# Patient Record
Sex: Male | Born: 1960 | Race: White | Hispanic: No | Marital: Single | Smoking: Former smoker
Health system: Southern US, Community
[De-identification: ages and names within clinical notes are randomized; demographics above are authoritative.]

## PROBLEM LIST (undated history)

## (undated) DIAGNOSIS — M25569 Pain in unspecified knee: Secondary | ICD-10-CM

## (undated) DIAGNOSIS — E785 Hyperlipidemia, unspecified: Secondary | ICD-10-CM

## (undated) DIAGNOSIS — E669 Obesity, unspecified: Secondary | ICD-10-CM

## (undated) DIAGNOSIS — E039 Hypothyroidism, unspecified: Secondary | ICD-10-CM

## (undated) DIAGNOSIS — I2541 Coronary artery aneurysm: Secondary | ICD-10-CM

## (undated) DIAGNOSIS — I10 Essential (primary) hypertension: Secondary | ICD-10-CM

## (undated) HISTORY — DX: Hypothyroidism, unspecified: E03.9

## (undated) HISTORY — PX: GALLBLADDER SURGERY: SHX652

## (undated) HISTORY — PX: LEG SURGERY: SHX1003

## (undated) HISTORY — DX: Coronary artery aneurysm: I25.41

## (undated) HISTORY — DX: Pain in unspecified knee: M25.569

## (undated) HISTORY — DX: Obesity, unspecified: E66.9

## (undated) HISTORY — DX: Hyperlipidemia, unspecified: E78.5

## (undated) HISTORY — PX: KNEE SURGERY: SHX244

---
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
## 2010-05-11 ENCOUNTER — Emergency Department (HOSPITAL_COMMUNITY): Admission: EM | Admit: 2010-05-11 | Discharge: 2010-05-11 | Payer: Self-pay | Admitting: Family Medicine

## 2010-12-17 ENCOUNTER — Observation Stay (HOSPITAL_COMMUNITY)
Admission: AD | Admit: 2010-12-17 | Discharge: 2010-12-18 | Disposition: A | Discharge status: Home / Self Care | Payer: 59 | Source: Other Acute Inpatient Hospital | Attending: Internal Medicine | Admitting: Internal Medicine

## 2010-12-17 ENCOUNTER — Observation Stay (HOSPITAL_COMMUNITY)
Admission: EM | Admit: 2010-12-17 | Discharge: 2010-12-17 | Disposition: A | Discharge status: Other Acute Inpatient Hospital | Payer: 59 | Source: Home / Self Care | Attending: Emergency Medicine | Admitting: Emergency Medicine

## 2010-12-17 ENCOUNTER — Emergency Department (HOSPITAL_COMMUNITY): Payer: 59

## 2010-12-17 DIAGNOSIS — R0602 Shortness of breath: Secondary | ICD-10-CM | POA: Insufficient documentation

## 2010-12-17 DIAGNOSIS — E785 Hyperlipidemia, unspecified: Secondary | ICD-10-CM | POA: Insufficient documentation

## 2010-12-17 DIAGNOSIS — F411 Generalized anxiety disorder: Secondary | ICD-10-CM | POA: Insufficient documentation

## 2010-12-17 DIAGNOSIS — R209 Unspecified disturbances of skin sensation: Secondary | ICD-10-CM | POA: Insufficient documentation

## 2010-12-17 DIAGNOSIS — Z79899 Other long term (current) drug therapy: Secondary | ICD-10-CM | POA: Insufficient documentation

## 2010-12-17 DIAGNOSIS — R079 Chest pain, unspecified: Principal | ICD-10-CM | POA: Insufficient documentation

## 2010-12-17 DIAGNOSIS — J42 Unspecified chronic bronchitis: Secondary | ICD-10-CM | POA: Insufficient documentation

## 2010-12-17 DIAGNOSIS — G43909 Migraine, unspecified, not intractable, without status migrainosus: Secondary | ICD-10-CM | POA: Insufficient documentation

## 2010-12-17 DIAGNOSIS — Z87891 Personal history of nicotine dependence: Secondary | ICD-10-CM | POA: Insufficient documentation

## 2010-12-17 DIAGNOSIS — I10 Essential (primary) hypertension: Secondary | ICD-10-CM | POA: Insufficient documentation

## 2010-12-17 DIAGNOSIS — Z8249 Family history of ischemic heart disease and other diseases of the circulatory system: Secondary | ICD-10-CM | POA: Insufficient documentation

## 2010-12-17 DIAGNOSIS — I253 Aneurysm of heart: Secondary | ICD-10-CM | POA: Insufficient documentation

## 2010-12-17 LAB — BASIC METABOLIC PANEL
BUN: 22 mg/dL (ref 6–23)
CO2: 29 mEq/L (ref 19–32)
Calcium: 9.2 mg/dL (ref 8.4–10.5)
GFR calc non Af Amer: >60 mL/min (ref >60)
Glucose, Bld: 141 mg/dL — ABNORMAL HIGH (ref 70–99)
Potassium: 4.2 mEq/L (ref 3.5–5.1)
Sodium: 140 mEq/L (ref 135–145)

## 2010-12-17 LAB — CBC
MCHC: 31.7 g/dL (ref 30.0–36.0)
Platelets: 278 10*3/uL (ref 150–400)
RDW: 14.5 % (ref 11.5–15.5)
WBC: 8.5 10*3/uL (ref 4.0–10.5)

## 2010-12-17 LAB — POCT CARDIAC MARKERS
CKMB, poc: <1 ng/mL — ABNORMAL LOW (ref 1.0–8.0)
Myoglobin, poc: 100 ng/mL (ref 12–200)

## 2010-12-17 LAB — DIFFERENTIAL
Basophils Absolute: 0 10*3/uL (ref 0.0–0.1)
Eosinophils Absolute: 0.1 10*3/uL (ref 0.0–0.7)
Eosinophils Relative: 1 % (ref 0–5)
Monocytes Absolute: 0.8 10*3/uL (ref 0.1–1.0)

## 2010-12-17 LAB — CARDIAC PANEL(CRET KIN+CKTOT+MB+TROPI)
CK, MB: 1 ng/mL (ref 0.3–4.0)
Relative Index: 0.8 (ref 0.0–2.5)

## 2010-12-18 ENCOUNTER — Observation Stay (HOSPITAL_COMMUNITY): Payer: 59

## 2010-12-18 DIAGNOSIS — M79609 Pain in unspecified limb: Secondary | ICD-10-CM

## 2010-12-18 DIAGNOSIS — I2510 Atherosclerotic heart disease of native coronary artery without angina pectoris: Secondary | ICD-10-CM

## 2010-12-18 DIAGNOSIS — Z01810 Encounter for preprocedural cardiovascular examination: Secondary | ICD-10-CM

## 2010-12-18 DIAGNOSIS — I2541 Coronary artery aneurysm: Secondary | ICD-10-CM

## 2010-12-18 LAB — CBC
HCT: 39.6 % (ref 39.0–52.0)
Hemoglobin: 12.9 g/dL — ABNORMAL LOW (ref 13.0–17.0)
MCHC: 32.6 g/dL (ref 30.0–36.0)
RBC: 4.62 MIL/uL (ref 4.22–5.81)

## 2010-12-18 LAB — BASIC METABOLIC PANEL
CO2: 29 mEq/L (ref 19–32)
Calcium: 8.6 mg/dL (ref 8.4–10.5)
Chloride: 104 mEq/L (ref 96–112)
GFR calc Af Amer: >60 mL/min (ref >60)
Glucose, Bld: 119 mg/dL — ABNORMAL HIGH (ref 70–99)
Sodium: 140 mEq/L (ref 135–145)

## 2010-12-18 LAB — D-DIMER, QUANTITATIVE: D-Dimer, Quant: 0.78 ug/mL-FEU — ABNORMAL HIGH (ref 0.00–0.48)

## 2010-12-25 NOTE — Consult Note (Signed)
NAMEBENNO, Edward Odom                ACCOUNT NO.:  [id number]  MEDICAL RECORD NO.:  [id number]           PATIENT TYPE:  O  LOCATION:  6533                         FACILITY:  [facility]  PHYSICIAN:  Kerin Perna, M.D.  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  The patient has had atypical chest pain and other pains for several years.  He was admitted to the hospital within the past 24 hours with substernal chest pain, which radiated to the left arm starting at about 2 a.m.  There was some transient shortness of breath as well.  He took the nitroglycerin he had at home and he states there was some relief.  He presented to the emergency department where his EKG was nonspecific and his troponin was negative.  The patient states he had a stress test performed within the last 2-3 years in [place], which was negative by his report.  The patient was evaluated by Dr. Eldridge Dace who recommended cardiac cath as he felt the stress test would be difficult to interpret in this patient with severe morbid obesity. Cardiac catheterization demonstrated no significant obstructive coronary disease with an EF of 60%.  There was dilatation of the ostium of the left main, which by measurement is probably 6-8 mm in diameter.  There is no evidence of a root aneurysm, dissection, or thrombus within the left main.  LVEDP was 25 and there is no  significant transaortic valve blood pressure gradient.  Because of the abnormality of the left main anatomy, a thoracic surgical evaluation was requested.  The patient states coronary disease runs heavily in his father's family.  He has never had a previous cardiac cath.  He is a nondiabetic.  He stopped smoking approximately 2-3 years ago and gained weight from 245-360 pounds currently.  PAST MEDICAL HISTORY: 1. Hypertension. 2. Morbid obesity. 3. Dyslipidemia. 4. History of migraine headaches. 5. Status post cholecystectomy, status post appendectomy.  HOME MEDICATIONS: 1. Pain patch one b.i.d. p.r.n. 2. Lasix 20 mg daily p.r.n. 3. Capsaicin patch 0.1 cm topically q.i.d. as needed. 4. Nitroglycerin p.r.n. chest pain. 5. Ibuprofen 200 mg t.i.d. for headaches. 6. Aleve 200 mg t.i.d. p.r.n. pain. 7. Doxazosin 4 mg p.o. at bedtime. 8. Procardia XL 90 mg q.a.m. 9. Triamterene/hydrochlorothiazide 75/50 mg p.o. q.p.m.  ALLERGIES:  The patient has intolerance to DEMEROL, but no true drug allergies in general.  SOCIAL HISTORY:  The patient is divorced and currently is an Production designer, theatre/television/film for outpatient dialysis center.  Has recently moved to [facility] from [place].  Is living with a friend.  He smoked  up until 3 years ago.  He denies significant alcohol intake.  FAMILY HISTORY:  Positive for coronary artery disease, cancer, and kidney disease.  REVIEW OF SYSTEMS:  CONSTITUTIONAL:  Positive for weight gain of 245-360 pounds over the past 3 years.  No history of fever or night sweats. ENT:  Negative for dental complaints, difficulty swallowing, or change in vision.  THORACIC:  Negative for history of thoracic trauma, pneumothorax, fractured ribs, pleural effusion, or hemoptysis.  CARDIAC: Positive for coronary artery disease.  Negative for history of murmur, arrhythmia.  2-D echo is pending.  GI:  Significant for his previous abdominal surgery as previously listed.   Negative for hepatitis, jaundice, or bright red blood per rectum.  UROLOGIC:  Negative for BPH or kidney stone.  VASCULAR:  Negative for DVT, claudication, or TIA. NEUROLOGIC:  Negative for stroke or seizures.  He has had several brain CT scans in the past for migraine headaches, which showed no other abnormalities.  HEMATOLOGIC:  Negative for bleeding disorder or previous blood transfusion.  PHYSICAL EXAMINATION:  VITAL SIGNS:  The patient is 5 feet 9 inches, weight is 360 pounds, blood pressure 155/90, pulse 80 sinus rhythm, saturation 97% on room air, he is afebrile. GENERAL APPEARANCE:  A middle-aged Caucasian male in his cardiology step- down unit with a dressing over the right wrist where he was catheterized 12 hours previously.  He is no distress. HEENT:  Normocephalic.  Pupils equal. NECK:  Without JVD, mass, or bruit. LYMPHATICS:  No palpable supraclavicular or cervical adenopathy.  Breath sounds are clear and there is no thoracic deformity other than his obesity. CARDIAC:  Regular rate and rhythm without S3 gallop or murmur. ABDOMEN:  Obese, soft without pulsatile mass. EXTREMITIES:  No clubbing or cyanosis.  There is mild pedal edema.NEUROLOGIC:  Alert and oriented without focal motor deficit. SKIN:  Without rash or lesion.  LABORATORY DATA:  Creatinine is 1.0.  Hemoglobin 14.5.  Glucose 140. Troponin normal.  D-dimer ordered is pending.  IMPRESSION AND PLAN:  The patient has a moderate aneurysm of the left main extending from the ostium to the takeoff of the left anterior descending and circumflex.  There is no evidence of dissection or thrombus and it is of moderate size.  There is no occlusive coronary artery disease.  He has preserved LV function.  Dr. Hoyle Barr opinion with which I concur is that his chest pain is not related to his coronary anatomy.  I would not recommend a surgical reconstruction of his coronaries based on this left main aneurysm noted on cath,  which I feel is a probable incidental finding; however, this will need to be followed serially.  I believe the risks of coronary artery disease in this 360-pound man would be a significant higher than risk of a coronary event from this left main aneurysm.  I would recommend following, the patient's coronary anatomy with serial cardiac CT scans, add a statin and daily aspirin to his current medications, as well as to continue good blood pressure control and weight reduction. While he is hospitalized, I would rule out PE, rule out other possible aneurysms in his neck or abdomen, and recommend a nutritional consult for weight loss.  Thank you for the consultation.     Kerin Perna, M.D.     PV/MEDQ  D:  12/18/2010  T:  12/19/2010  Job:  [id number]  cc:   Corky Crafts, MD  Electronically Signed by Kerin Perna M.D. on 12/25/2010 06:47:38 PM

## 2010-12-27 NOTE — Discharge Summary (Signed)
NAMEDIARRA, Edward Odom                ACCOUNT NO.:  [id number]  MEDICAL RECORD NO.:  [id number]           PATIENT TYPE:  O  LOCATION:  6533                         FACILITY:  [facility]  PHYSICIAN:  Lonia Blood, M.D.       DATE OF BIRTH:  01/21/1961  DATE OF ADMISSION:  12/17/2010 DATE OF DISCHARGE:  12/18/2010                              DISCHARGE SUMMARY   PRIMARY CARE PHYSICIAN:  This patient has been referred to [place] [facility].  PRIMARY CARDIOLOGIST:  Corky Crafts, MD from [facility] Cardiology.  DISCHARGE DIAGNOSES: 1. Musculoskeletal chest pain. 2. Incidental finding of left main moderate aneurysm. 3. Morbid obesity. 4. Hypertension. 5. Hyperlipidemia.  DISCHARGE MEDICATIONS: 1. Aleve 220 mg two tablets every 8 hours as needed for pain. 2. Capsaicin to the knees four times a day as needed. 3. Doxazosin 4 mg daily. 4. Lasix 20 mg daily as needed for fluid. 5. Nitroglycerin 0.4 mg under tongue every 5 minutes as needed for     chest pain. 6. Nifedipine extended release 90 mg daily. 7. Atenolol 25 mg daily. 8. Salonpas pain relief patch every 12 hours as needed. 9. Triamterene/hydrochlorothiazide 75/50 one tablet daily.  CONDITION ON DISCHARGE:  [name] was discharged in good condition, alert, oriented in no acute distress.  He will follow up with Dr. Eldridge Dace from [facility] Cardiology and with Dr. Kathlee Nations Trigt from Thoracic Surgery.  HISTORY AND PHYSICAL:  Refer to dictated H and P done by Dr. Cleotis Lema.  PROCEDURES THIS ADMISSION: 1. On December 17 1010, the patient underwent a cardiac catheterization,     findings of a moderate left main aortic aneurysm, mid-to-distal     portion; anomalous takeoff of the RCA, normal ventricular function. 2. Abdominal ultrasound, which was negative for finding of abdominal     aortic aneurysm. 3. Carotid ultrasound, which was negative for carotid artery disease. 4. Venous ultrasound, which was negative for deep vein thrombosis,   positive for Baker cyst at the left knee. 5. Peripheral arterial Dopplers, which were negative for findings of     any peripheral vascular disease. 6. Transthoracic echocardiogram revealed ejection fraction 55-60%,     some diastolic dysfunction.  HOSPITAL COURSE:  [name] is a 50 year old gentleman with hypertension, hyperlipidemia, and morbid obesity, presented to the Emergency Room of [facility] on December 17, 2010, with some complaints of chest pain.  He was admitted to telemetry at the [facility] and he had cardiac markers, which were within normal limits.  His troponins were 0.01 x3.  He was taken to the Cardiac Catheterization Lab of [facility] on December 17, 2010 and an incidental finding of a left main aneurysm was found.  Because of that, the patient was seen in consultation by Dr. Kathlee Nations Trigt from Thoracic Surgery and he felt that this aneurysm could be monitored as an outpatient.  [name] was continued on his antihypertensives and he is going to follow a weight reduction program before any elective coronary artery aneurysm repair.     Lonia Blood, M.D.     SL/MEDQ  D:  12/25/2010  T:  12/26/2010  Job:  [id number]  cc:   Corky Crafts, MD Kerin Perna, M.D.  Electronically Signed by Lonia Blood M.D. on 12/27/2010 02:17:26 PM

## 2011-01-03 NOTE — H&P (Addendum)
NAMERUEL, DIMMICK                ACCOUNT NO.:  [id number]  MEDICAL RECORD NO.:  [id number]           PATIENT TYPE:  E  LOCATION:  WLED                         FACILITY:  [facility]  PHYSICIAN:  Baltazar Najjar, MD     DATE OF BIRTH:  August 09, 1961  DATE OF ADMISSION:  12/17/2010 DATE OF DISCHARGE:                             HISTORY & PHYSICAL   PRIMARY CARE PHYSICIAN:  Unassigned.  The patient's PCP is from [place] area.  CODE STATUS:  Full code.  CHIEF COMPLAINT:  Chest pain.  HISTORY OF PRESENT ILLNESS:  [name] is a 50 year old morbidly obese Caucasian man who presented to the ER today with chief complaint of chest pain started early morning today around 2:00 a.m.  He described his chest pain as dull pain radiating to his left arm with transient tingling in his right upper extremity.  His pain lasted about 20 minutes and resolved with 2 doses of nitroglycerin.  He denies any associated diaphoresis or nausea.  However, he had transient shortness of breath. As per the patient, he has been experiencing similar chest pains on and off in the past.  He had his initially episode back in 1992, at that time was diagnosed with anxiety and in the last couple of years he has been prescribed nitroglycerin for angina.  As per him, he had stress test done in [place] which was unremarkable.  He has never seen a cardiologist and never had a cardiac cath in the past.  In the ER, the patient's EKG was unremarkable.  His troponin was negative and we are asked to see him for admission to rule out acute coronary syndrome and for cardiac workup.  PAST MEDICAL HISTORY: 1. Hypertension. 2. Hyperlipidemia. 3. Morbid obesity. 4. Migraine headaches.  ALLERGIES:  No known drug allergy.  However, he requested to list DEMEROL as allergy for him because as per him, he just do not like it. However, he does not report any specific allergic symptoms to Demerol.  HOME MEDICATIONS: 1.  Salonpas patch one patch every 12 hours as needed for pain. 2. Lasix 20 mg p.o. daily p.r.n., he took that for 7 days. 3. Capsaicin 0.1 cm topically 4 times a day as needed. 4. Nitroglycerin sublingual 0.4 mg q.5 minutes p.r.n. x3 doses. 5. Ibuprofen 200 mg q.8 h p.r.n. for headaches. 6. Aleve 220 mg 2 tablets q.8 h p.r.n. for pain. 7. Doxazosin 4 mg 1 tablet daily at bedtime. 8. Procardia XL 90 mg 1 tablet q.a.m. 9. Triamterene/hydrochlorothiazide 75/50 mg q.p.m..  SOCIAL HISTORY:  He is divorced.  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  Relocated recently from [place] to [place] area.  Used to smoke in the past for about 10 years, used to smoke one pack a day of cigarettes, quit 2 years ago.  Drinks alcohol occasionally.  Denies any illicit drug use.  FAMILY HISTORY:  Significant for cardiac disease in his both parents side.  Also kidney disease and cancers.  REVIEW OF SYSTEMS:  As above in HPI.  Other systems reported negative. CHEST:  Significant for shortness of breath  with exertion and rest. CARDIOVASCULAR:  Significant for chest pain as above.  ABDOMEN:  Denies any nausea or vomiting or change in his bowel habits.  GU: Denies any dysuria, however, he stated that he had occasional increasing urinary frequency.  No hematuria, constipation.  No fever or chills.  PHYSICAL EXAMINATION:  VITAL SIGNS:  Blood pressure 164/91, pulse 85, temperature 98, O2 sat 97% on room air. GENERAL:  He is alert, not in acute distress. NECK:  Supple.  No JVD. LUNGS:  Showed decreased breathing sounds with no rales or wheezing. CARDIOVASCULAR:  S1, S2.  Regular rhythm and rate. ABDOMEN:  Obese, soft, nontender. EXTREMITIES:  Showed trace edema. NEUROLOGICAL EXAM:  He is fully alert and oriented x3.  No focal neurological deficit appreciated.  LABORATORY DATA:  Potassium 4.2, BUN 22, creatinine 1.01.  GFR more than 6.  Glucose 141, troponin less than 0.05.  Hemoglobin 14.5,  WBC is 8.5, platelet count 278.  RADIOLOGY/IMAGING DATA:  Chest x-ray showed no acute finding.  EKG showed normal sinus rhythm at 92 beats per minutes, possible left atrial enlargement.  ASSESSMENT/PLAN:  The patient is a 50 year old morbidly obese man with hypertension, hyperlipidemia, former smoker, positive family history for cardiac disease.  PLAN: 1. We will admit the patient to telemetry floor. 2. We will cycle cardiac enzymes x3. 3. Continue with nitroglycerin p.r.n.  We will also add aspirin 81 mg     p.o. daily and add metoprolol 12.5 mg b.i.d. for blood pressure     control and for cardioprotective reasons. 4. I will order 2-D echocardiogram. 5. We will consult Cardiology for further cardiac workup. 6. Check lipid panel and we can start him on simvastatin as needed. 7. The patient quit smoking 2 years ago and he is not actively     smoking. 8. Encouraged to lose some weight and exercise and lifestyle     modification. 9. The patient is a full code. 10.We will ask social worker to assign him a PCP on discharge.    ______________________________ Baltazar Najjar, MD   ______________________________ Baltazar Najjar, MD    SA/MEDQ  D:  12/17/2010  T:  12/17/2010  Job:  [id number]  Electronically Signed by Hannah Beat MD on 01/05/2011 08:08:38 PM

## 2011-01-10 NOTE — Cardiovascular Report (Signed)
NAMEVIYAN, Edward Odom                ACCOUNT NO.:  [id number]  MEDICAL RECORD NO.:  [id number]           PATIENT TYPE:  O  LOCATION:  6533                         FACILITY:  [facility]  PHYSICIAN:  Corky Crafts, MDDATE OF BIRTH:  Nov 03, 1960  DATE OF PROCEDURE:  12/17/2010 DATE OF DISCHARGE:                           CARDIAC CATHETERIZATION   PROCEDURES PERFORMED:  Left heart catheterization, left ventriculogram, coronary angiogram of the aortic root shot.  OPERATOR:  Corky Crafts, MD  INDICATIONS:  Persistent chest pain.  PROCEDURE NARRATIVE:  The risks and benefits of cardiac catheterization were explained to the patient.  Informed consent was obtained.  He was brought to the cath lab.  He was prepped and draped in the usual sterile fashion.  His right wrist was infiltrated with 1% lidocaine.  A 5-French glide sheath was placed into the right radial artery.  There was difficulty in navigating the wire up the radial and angiogram was performed through the sheath.  Verapamil and nitroglycerin were given through the sheath on multiple occasions.  Left coronary angiography was performed using a JL-3.5 catheter.  The catheter was advanced to the vessel ostium under fluoroscopic guidance.  Digital angiography was performed in multiple projections using hand injection of contrast. Right coronary artery angiography was performed using eventually an LCB guiding catheter.  The right coronary has a anomalous takeoff.  It is more anterior.  The pigtail catheter was used to cross the aortic valve under fluoroscopic guidance.  A power injection of contrast was performed in the RAO projection image.  The left ventricle catheter was withdrawn under continuous hemodynamic pressure monitoring.  Aortic root shot was done with a power injection of contrast in the RAO projection to help image the right coronary artery.  The sheath was removed and a TR band was placed.  The case was done  with heparin used for anticoagulation throughout.  FINDINGS:  The left main is patent and normal size proximally.  In the mid to distal left main, there is a large aneurysm.  The left-sided branches then arise from this aneurysm.  The left circumflex is a medium- sized vessel and appears widely patent.  There is a ramus vessel which is medium-sized and widely patent.  The left anterior descending is a large vessel which reaches the apex. There are minimal mid vessel irregularities.  There is a diagonal which likely was a proximal diagonal off of the LAD.  However, due to the distal left main aneurysm, it comes off separately from the aneurysm itself.  It appears widely patent.  The right coronary artery has an anterior takeoff.  It is very tortuous in the mid section and it is a large dominant vessel.  There is a large PDA.  The entire right coronary artery system is widely patent.  The right radial angiogram showed no significant loops for tortuosity.  Left ventriculogram showed normal ventricular function with an ejection fraction of 60%.  HEMODYNAMIC RESULTS:  135/20 with a left ventricular pressure.  LVEDP of 25 mmHg.  Aortic pressure 143/104 with a mean aortic pressure of 124 mmHg. Aortic root shows no thoracic  aneurysm.  IMPRESSION: 1. Large left main aneurysm in the mid to distal portion which     distorted some of the normal left coronary artery anatomy.  There     is no hemodynamically significant atherosclerosis in the left     coronary artery system. 2. 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 3. Normal ventricular function. 4. Mildly increased LVEDP. 5. Normal thoracic aorta.  RECOMMENDATIONS:  Continue risk factor modification including blood pressure control and encouraged weight loss.  We will consult cardiothoracic surgery to obtain assistance with management of this  left main aneurysm.     Corky Crafts, MD     JSV/MEDQ  D:  12/17/2010  T:  12/18/2010  Job:  [id number]  Electronically Signed by Lance Muss MD on 01/10/2011 01:32:08 PM

## 2011-03-07 ENCOUNTER — Other Ambulatory Visit: Payer: Self-pay | Admitting: Cardiothoracic Surgery

## 2011-03-07 DIAGNOSIS — I2541 Coronary artery aneurysm: Secondary | ICD-10-CM

## 2011-04-12 ENCOUNTER — Encounter (HOSPITAL_COMMUNITY): Payer: Self-pay

## 2011-04-12 ENCOUNTER — Ambulatory Visit (HOSPITAL_COMMUNITY)
Admission: RE | Admit: 2011-04-12 | Discharge: 2011-04-12 | Disposition: A | Discharge status: Home / Self Care | Payer: 59 | Source: Ambulatory Visit | Attending: Cardiothoracic Surgery | Admitting: Cardiothoracic Surgery

## 2011-04-12 ENCOUNTER — Other Ambulatory Visit: Payer: Self-pay | Admitting: Cardiothoracic Surgery

## 2011-04-12 DIAGNOSIS — I728 Aneurysm of other specified arteries: Secondary | ICD-10-CM | POA: Insufficient documentation

## 2011-04-12 DIAGNOSIS — I2541 Coronary artery aneurysm: Secondary | ICD-10-CM

## 2011-04-12 DIAGNOSIS — K7689 Other specified diseases of liver: Secondary | ICD-10-CM | POA: Insufficient documentation

## 2011-04-12 HISTORY — DX: Essential (primary) hypertension: I10

## 2011-04-12 MED ORDER — IOHEXOL 350 MG/ML SOLN
80.0000 mL | Freq: Once | INTRAVENOUS | Status: AC | PRN
  Administered 2011-04-12: 80 mL via INTRAVENOUS

## 2011-04-17 ENCOUNTER — Ambulatory Visit (INDEPENDENT_AMBULATORY_CARE_PROVIDER_SITE_OTHER): Payer: 59 | Admitting: Cardiothoracic Surgery

## 2011-04-17 DIAGNOSIS — I253 Aneurysm of heart: Secondary | ICD-10-CM

## 2011-04-18 NOTE — Assessment & Plan Note (Signed)
OFFICE VISIT  JACOBY, ZANNI DOB:  12/17/1960                                        April 17, 2011 CHART #:  [id number]  CURRENT PROBLEMS: 1. Left main coronary aneurysm. 2. Morbid obesity. 3. Hypertension. 4. Sleep apnea. 5. Type 2 diabetes mellitus.  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  The patient had no obstructive coronary artery disease but was noted to have a left main aneurysm.  His EF was normal at 60% and LVEDP was 25.  Since the patient weighed 360 pounds and otherwise had clean coronaries, he was not felt to be an operative candidate since the risk of surgery would outweigh the risk of the coronary aneurysm.  The coronary aneurysm was moderate in size.  For followup, I felt that a cardiac CT scan would be the best way to follow the status of the coronary aneurysm and this was performed 5 days ago. It clearly shows a 1.2-cm aneurysm of the distal left main at the area of the bifurcation.  There was no evidence of thrombus, minimal plaque, or ulceration.  It should allow an active way to follow the status and size of his aneurysm for time.  In the interim since his hospitalization in April he has established care with Dr. Donette Larry and he has been found to have sleep apnea for which he takes a BiPAP mask and he has a hemoglobin A1c of 8.3.  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 His blood pressure is controlled with atenolol, Maxzide, doxazosin and Lasix.  He was found to be hypothyroid and was started on Synthroid.  He takes 81 mg of aspirin a day.  PHYSICAL EXAMINATION:  Vital Signs:  Blood pressure 146/80, pulse is 90, saturation 95%.  His weight is 380 pounds.  Cardiac:  Rhythm is  regular. Lungs:  Breath sounds are clear.  Extremities:  He has some ankle edema 1 to 2+.  LABORATORY DATA:  I reviewed the cardiac CT scan with the patient which clearly shows the aneurysm which appears moderate 1.2 cm in maximal diameter.  IMPRESSION AND PLAN:  The patient will be extremely high risk operative candidate and I feel the risk of surgery would be higher than the risk of this coronary aneurysm at this point and at the size.  I would recommend serial his cardiac CT scans to assess the aneurysm on an annual basis.  I would recommend increasing his aspirin to 325 mg a day to reduce the risk of platelet clumping and he understands the other risk factors for a vascular disease including blood pressure control, weight control, diabetes and regular exercise.  I plan on seeing him back in a year with a cardiac CT scan.  Kerin Perna, M.D. Electronically Signed  PV/MEDQ  D:  04/17/2011  T:  04/18/2011  Job:  [id number]  cc:   Vonzell Schlatter. Patsi Sears, M.D. Georgann Housekeeper, MD Corky Crafts, MD

## 2011-05-01 ENCOUNTER — Ambulatory Visit: Payer: 59 | Admitting: Cardiothoracic Surgery

## 2012-04-01 ENCOUNTER — Other Ambulatory Visit: Payer: Self-pay | Admitting: Cardiothoracic Surgery

## 2012-04-01 DIAGNOSIS — I253 Aneurysm of heart: Secondary | ICD-10-CM

## 2012-04-21 ENCOUNTER — Other Ambulatory Visit: Payer: Self-pay | Admitting: Cardiothoracic Surgery

## 2012-05-13 ENCOUNTER — Encounter: Payer: Self-pay | Admitting: Cardiothoracic Surgery

## 2012-05-13 ENCOUNTER — Ambulatory Visit (INDEPENDENT_AMBULATORY_CARE_PROVIDER_SITE_OTHER): Payer: 59 | Admitting: Cardiothoracic Surgery

## 2012-05-13 VITALS — BP 144/88 | HR 88 | Resp 20 | Ht 70.0 in | Wt 376.0 lb

## 2012-05-13 DIAGNOSIS — E039 Hypothyroidism, unspecified: Secondary | ICD-10-CM | POA: Insufficient documentation

## 2012-05-13 DIAGNOSIS — E785 Hyperlipidemia, unspecified: Secondary | ICD-10-CM | POA: Insufficient documentation

## 2012-05-13 DIAGNOSIS — I10 Essential (primary) hypertension: Secondary | ICD-10-CM | POA: Insufficient documentation

## 2012-05-13 DIAGNOSIS — I2541 Coronary artery aneurysm: Secondary | ICD-10-CM

## 2012-05-13 DIAGNOSIS — E119 Type 2 diabetes mellitus without complications: Secondary | ICD-10-CM

## 2012-05-13 NOTE — Progress Notes (Signed)
PCP is Georgann Housekeeper, MD Referring Provider is Corky Crafts., *  Chief Complaint  Patient presents with  . Follow-up    1 year f/u surveillance of coronary aneurysm,     HPI: Morbidly obese diabetic who is evaluated for chest pain 18 months ago with cardiac cath by Dr. Vertis Kelch. 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 The patient is done well without chest pain on aspirin beta blocker and his cholesterol has been normal. He has been unable to lose weight. He returns now for review of his situation.   Past Medical History  Diagnosis Date  . Hypertension   . Diabetes mellitus   . Hyperlipidemia   . Obesity   . Hypothyroidism   . Coronary artery aneurysm     No past surgical history on file.  No family history on file.  Social History History  Substance Use Topics  . Smoking status: Former Smoker    Types: Cigarettes  . Smokeless tobacco: Not on file  . Alcohol Use: No    Current Outpatient Prescriptions  Medication Sig Dispense Refill  . aspirin 325 MG tablet Take 325 mg by mouth daily.      Marland Kitchen atenolol (TENORMIN) 25 MG tablet Take 25 mg by mouth daily.      . Camphor-Menthol-Methyl Sal (SALONPAS) 1.2-5.7-6.3 % PTCH Apply topically.      . capsaicin (ZOSTRIX) 0.025 % cream Apply topically 2 (two) times daily.      . cetirizine (ZYRTEC) 10 MG tablet Take 10 mg by mouth daily.      Marland Kitchen doxazosin (CARDURA) 4 MG tablet Take 4 mg by mouth at bedtime.      Marland Kitchen glipiZIDE (GLUCOTROL) 5 MG tablet Take 5 mg by mouth 2 (two) times daily before a meal.      . levothyroxine (SYNTHROID, LEVOTHROID) 50 MCG tablet Take 50 mcg by mouth daily.      . metFORMIN (GLUCOPHAGE) 500 MG tablet Take 500 mg by mouth 2 (two) times daily with a meal.      . Misc Natural Products (OSTEO BI-FLEX JOINT SHIELD) TABS Take by mouth 2 (two) times daily.      . naproxen sodium (ANAPROX) 220 MG tablet Take 220 mg by mouth as needed.      Marland Kitchen  NIFEdipine (ADALAT CC) 90 MG 24 hr tablet Take 90 mg by mouth daily.      Marland Kitchen triamterene-hydrochlorothiazide (MAXZIDE) 75-50 MG per tablet Take 1 tablet by mouth daily.        Allergies  Allergen Reactions  . Demerol   . Metoprolol-Hydrochlorothiazide     Review of Systems no fever no weight loss no night sweats no chest pain  BP 144/88  Pulse 88  Resp 20  Ht 5\' 10"  (1.778 m)  Wt 376 lb (170.552 kg)  BMI 53.95 kg/m2  SpO2 94% Physical Exam Gen. 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 No symptoms. Repeat imaging with structural cardiac CT or coronary arteriogram will be needed which we'll we'll set up and and have the patient return the office for discussion. Certainly if the coronary aneurysm stays stable and asymptomatic I would not recommend elective surgery due to his morbid obesity, sleep apnea, diabetes, which would place the patient had an increased risk.  Plan:

## 2012-05-22 ENCOUNTER — Other Ambulatory Visit: Payer: Self-pay | Admitting: Cardiothoracic Surgery

## 2012-05-22 DIAGNOSIS — I2541 Coronary artery aneurysm: Secondary | ICD-10-CM

## 2012-05-26 ENCOUNTER — Encounter: Payer: Self-pay | Admitting: *Deleted

## 2012-05-26 LAB — CREATININE, SERUM: Creat: 0.8 mg/dL (ref 0.50–1.35)

## 2012-05-26 LAB — BUN: BUN: 19 mg/dL (ref 6–23)

## 2012-05-29 ENCOUNTER — Other Ambulatory Visit: Payer: Self-pay | Admitting: Cardiothoracic Surgery

## 2012-05-29 ENCOUNTER — Ambulatory Visit (HOSPITAL_COMMUNITY)
Admission: RE | Admit: 2012-05-29 | Discharge: 2012-05-29 | Disposition: A | Discharge status: Home / Self Care | Payer: 59 | Source: Ambulatory Visit | Attending: Cardiothoracic Surgery | Admitting: Cardiothoracic Surgery

## 2012-05-29 DIAGNOSIS — I253 Aneurysm of heart: Secondary | ICD-10-CM

## 2012-05-29 DIAGNOSIS — I2541 Coronary artery aneurysm: Secondary | ICD-10-CM | POA: Insufficient documentation

## 2012-05-29 DIAGNOSIS — K7689 Other specified diseases of liver: Secondary | ICD-10-CM | POA: Insufficient documentation

## 2012-05-29 DIAGNOSIS — I719 Aortic aneurysm of unspecified site, without rupture: Secondary | ICD-10-CM

## 2012-05-29 LAB — GLUCOSE, CAPILLARY: Glucose-Capillary: 152 mg/dL — ABNORMAL HIGH (ref 70–99)

## 2012-05-29 MED ORDER — DILTIAZEM HCL 25 MG/5ML IV SOLN
10.0000 mg | Freq: Once | INTRAVENOUS | Status: DC
  Filled 2012-05-29: qty 5

## 2012-05-29 MED ORDER — DILTIAZEM HCL 25 MG/5ML IV SOLN
10.0000 mg | Freq: Once | INTRAVENOUS | Status: AC
  Administered 2012-05-29: 10 mg via INTRAVENOUS

## 2012-05-29 MED ORDER — DILTIAZEM HCL 25 MG/5ML IV SOLN
5.0000 mg | Freq: Once | INTRAVENOUS | Status: AC
  Administered 2012-05-29: 5 mg via INTRAVENOUS
  Filled 2012-05-29: qty 5

## 2012-05-29 MED ORDER — NITROGLYCERIN 0.4 MG SL SUBL
0.4000 mg | SUBLINGUAL_TABLET | SUBLINGUAL | Status: DC | PRN
  Administered 2012-05-29: 0.4 mg via SUBLINGUAL

## 2012-05-29 MED ORDER — DILTIAZEM HCL 25 MG/5ML IV SOLN
5.0000 mg | Freq: Once | INTRAVENOUS | Status: AC
  Administered 2012-05-29: 5 mg via INTRAVENOUS

## 2012-05-29 MED ORDER — IOHEXOL 350 MG/ML SOLN
100.0000 mL | Freq: Once | INTRAVENOUS | Status: AC | PRN
  Administered 2012-05-29: 100 mL via INTRAVENOUS

## 2012-06-10 ENCOUNTER — Ambulatory Visit (INDEPENDENT_AMBULATORY_CARE_PROVIDER_SITE_OTHER): Payer: 59 | Admitting: Cardiothoracic Surgery

## 2012-06-10 VITALS — BP 150/85 | HR 88 | Resp 20 | Ht 70.0 in | Wt 376.0 lb

## 2012-06-10 DIAGNOSIS — I2541 Coronary artery aneurysm: Secondary | ICD-10-CM

## 2012-06-10 DIAGNOSIS — E119 Type 2 diabetes mellitus without complications: Secondary | ICD-10-CM

## 2012-06-10 NOTE — Progress Notes (Signed)
PCP is Georgann Housekeeper, MD Referring Provider is Corky Crafts., *  Chief Complaint  Patient presents with  . Follow-up    3 week f/u discuss cardiac CT from 05/29/12    HPI: Patient returns review review of a coronary aneurysm of the distal left main. This was noted first in 2012 when he had a coronary arteriogram in April 2012. At that time he presented with atypical chest pain and a cardiac cath was performed showing the moderate size coronary aneurysm. There is no other coronary disease. There's no evidence of stenosis plaque or thrombus. EF was 60% LVEDP is 25. I followed the patient in August of that year with a cardiac CT scan for cardiac morphology and this demonstrated no change with the aneurysm measuring 1.6 x 1.3 cm. The patient return currently, your later in the cardiac CT scan for cardiac morphology shows the aneurysm to measure 1.5x1.4 cm. There is no thrombus. The patient is asymptomatic. He is on a beta blocker aspirin. His lipid profile is been checked by his primary cardiologist Dr. Vertis Kelch. is been within normal limits. He still weighs close to 400 pounds. He is diabetes, sleep apnea and hypertension. He denies angina.  Past Medical History  Diagnosis Date  . Hypertension   . Diabetes mellitus   . Hyperlipidemia   . Obesity   . Hypothyroidism   . Coronary artery aneurysm     No past surgical history on file.  No family history on file.  Social History History  Substance Use Topics  . Smoking status: Former Smoker    Types: Cigarettes  . Smokeless tobacco: Not on file  . Alcohol Use: No    Current Outpatient Prescriptions  Medication Sig Dispense Refill  . aspirin 325 MG tablet Take 325 mg by mouth daily.      Marland Kitchen atenolol (TENORMIN) 25 MG tablet Take 25 mg by mouth daily.      . Camphor-Menthol-Methyl Sal (SALONPAS) 1.2-5.7-6.3 % PTCH Apply topically.      . capsaicin (ZOSTRIX) 0.025 % cream Apply topically 2 (two) times daily.      . cetirizine  (ZYRTEC) 10 MG tablet Take 10 mg by mouth daily.      Marland Kitchen doxazosin (CARDURA) 4 MG tablet Take 4 mg by mouth at bedtime.      Marland Kitchen glipiZIDE (GLUCOTROL) 5 MG tablet Take 5 mg by mouth 2 (two) times daily before a meal.      . levothyroxine (SYNTHROID, LEVOTHROID) 50 MCG tablet Take 50 mcg by mouth daily.      . metFORMIN (GLUCOPHAGE) 500 MG tablet Take 500 mg by mouth 2 (two) times daily with a meal.      . Misc Natural Products (OSTEO BI-FLEX JOINT SHIELD) TABS Take by mouth 2 (two) times daily.      . naproxen sodium (ANAPROX) 220 MG tablet Take 220 mg by mouth as needed.      Marland Kitchen NIFEdipine (ADALAT CC) 90 MG 24 hr tablet Take 90 mg by mouth daily.      Marland Kitchen triamterene-hydrochlorothiazide (MAXZIDE) 75-50 MG per tablet Take 1 tablet by mouth daily.        Allergies  Allergen Reactions  . Demerol   . Metoprolol-Hydrochlorothiazide     Review of Systems no fever no chest pains no orthopnea no palpitations  BP 150/85  Pulse 88  Resp 20  Ht 5\' 10"  (1.778 m)  Wt 376 lb (170.552 kg)  BMI 53.95 kg/m2  SpO2 96% Physical Exam Alert  and pleasant exam unchanged from 2 weeks ago  Diagnostic Tests: CT cardiac morphology images reviewed patient showing the aneurysm at the distal left main bifurcation remained stable in size approximately 1.5 cm. The ascending aorta is normal. The remainder of the intrathoracic structures and upper abdominal structures are normal. No adenopathy.  Impression: We will follow the patient in year for review. As the area in question is been stable for a year and half will probably wait to rescan him another 2 years. Plan: Return in one year.

## 2012-08-08 IMAGING — CR DG OS CALCIS 2+V*R*
2 series · 2 of 2 positions shown · non-contrast
Comparison: None

CLINICAL DATA: Right heel pain.

RIGHT OS CALCIS - 2+ VIEW

[view not recorded (1 of 2)]
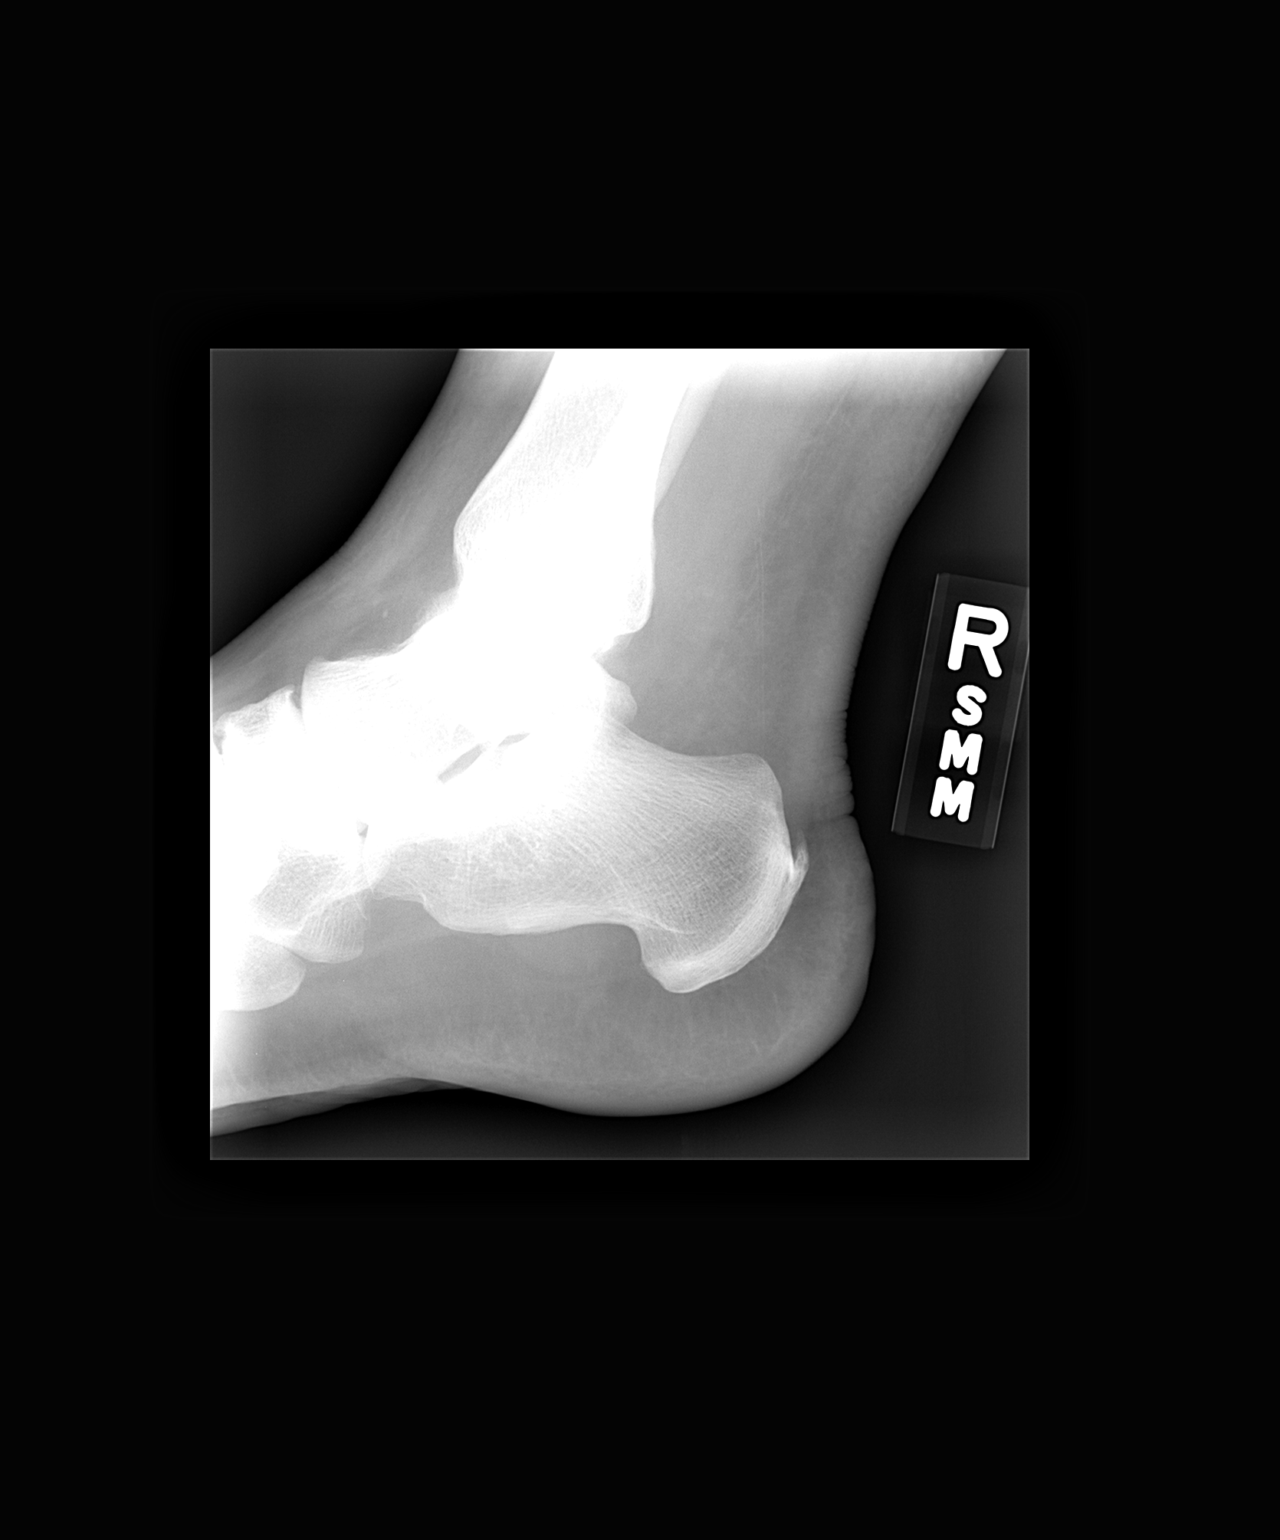

[view not recorded (2 of 2)]
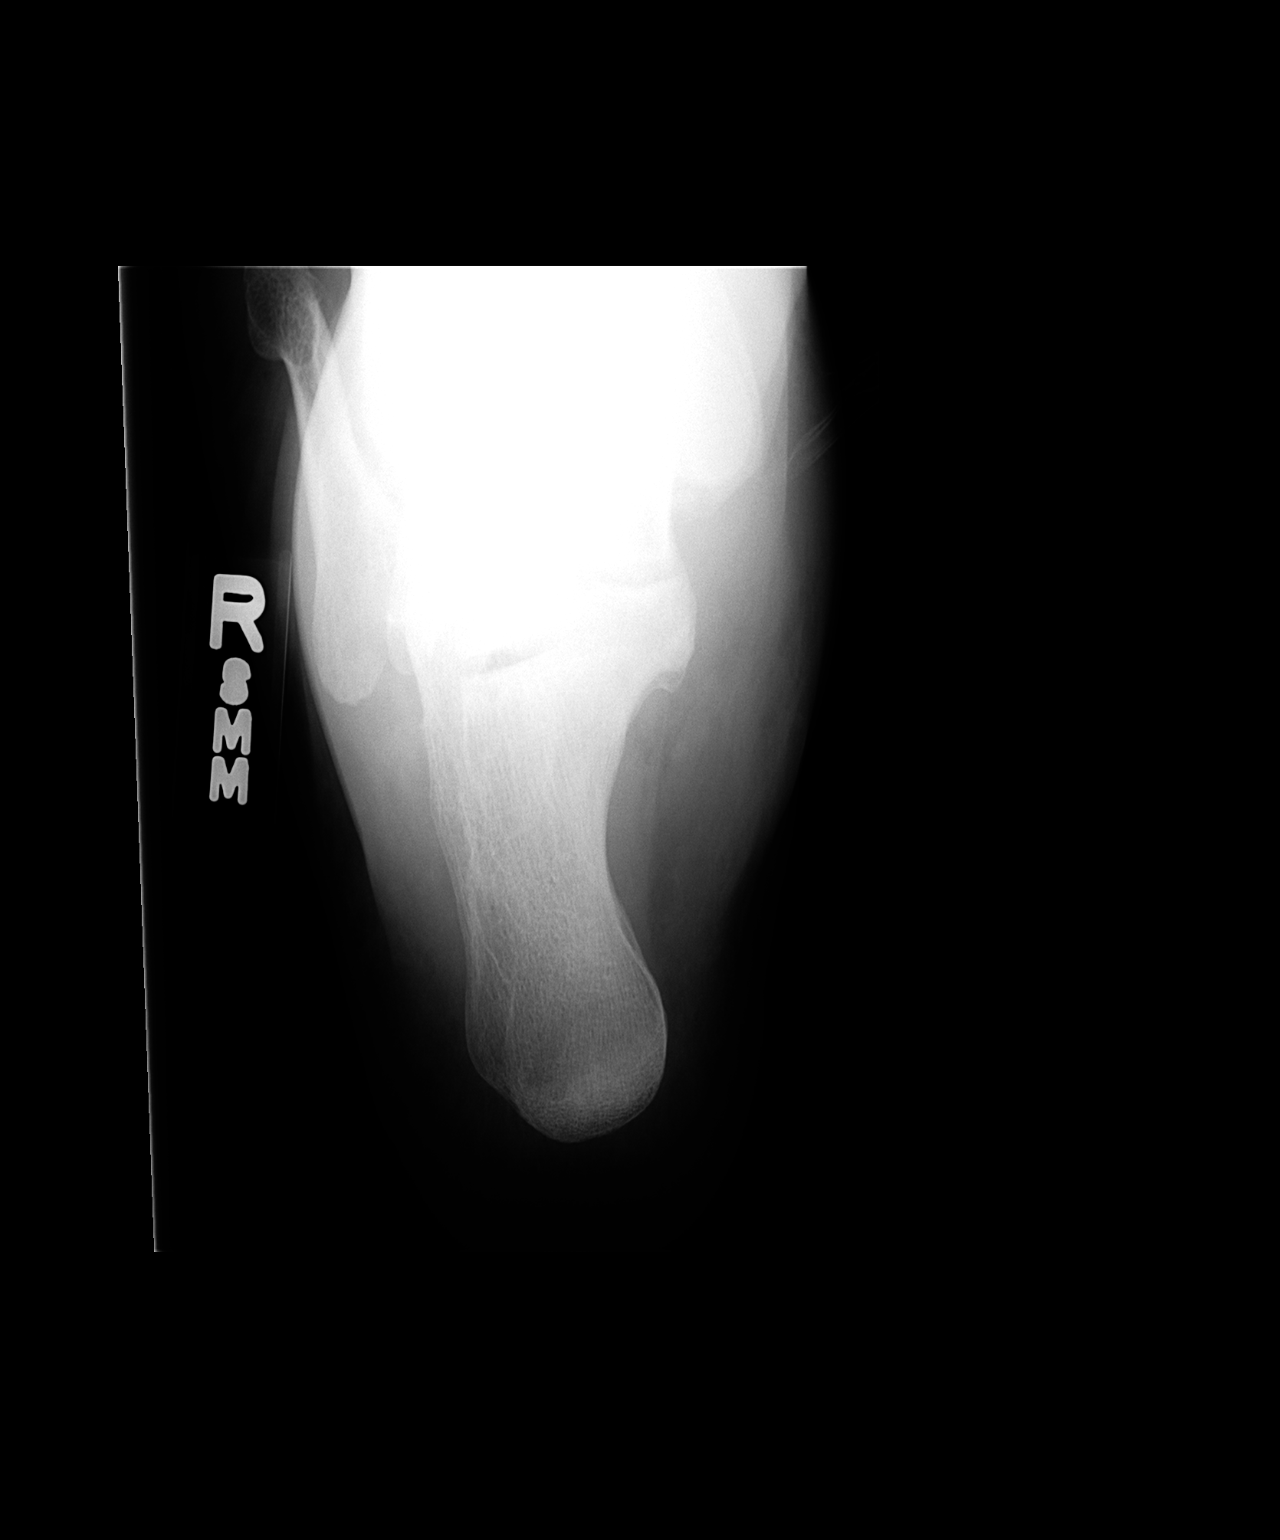

[2 of 2 positions shown; findings below may reference images not displayed]

FINDINGS: There is a small bony spur at the Achilles tendon
attachment.  No plantar  heel spur.  No findings for stress
fracture or avascular necrosis.
IMPRESSION: No acute bony findings.
Small calcaneal heel spur at the Achilles attachment site.

## 2013-06-09 ENCOUNTER — Ambulatory Visit: Payer: 59 | Admitting: Cardiothoracic Surgery

## 2013-06-30 ENCOUNTER — Ambulatory Visit: Payer: 59 | Admitting: Cardiothoracic Surgery

## 2015-09-19 ENCOUNTER — Other Ambulatory Visit: Payer: Self-pay | Admitting: Internal Medicine

## 2015-09-19 DIAGNOSIS — I712 Thoracic aortic aneurysm, without rupture, unspecified: Secondary | ICD-10-CM

## 2015-09-26 ENCOUNTER — Ambulatory Visit
Admission: RE | Admit: 2015-09-26 | Discharge: 2015-09-26 | Disposition: A | Discharge status: Home / Self Care | Payer: 59 | Source: Ambulatory Visit | Attending: Internal Medicine | Admitting: Internal Medicine

## 2015-09-26 DIAGNOSIS — I712 Thoracic aortic aneurysm, without rupture, unspecified: Secondary | ICD-10-CM

## 2015-09-26 MED ORDER — IOPAMIDOL (ISOVUE-370) INJECTION 76%
100.0000 mL | Freq: Once | INTRAVENOUS | Status: AC | PRN
  Administered 2015-09-26: 100 mL via INTRAVENOUS

## 2017-09-25 ENCOUNTER — Other Ambulatory Visit: Payer: Self-pay | Admitting: Internal Medicine

## 2017-09-25 DIAGNOSIS — I712 Thoracic aortic aneurysm, without rupture, unspecified: Secondary | ICD-10-CM

## 2017-10-01 ENCOUNTER — Ambulatory Visit
Admission: RE | Admit: 2017-10-01 | Discharge: 2017-10-01 | Disposition: A | Discharge status: Home / Self Care | Payer: 59 | Source: Ambulatory Visit | Attending: Internal Medicine | Admitting: Internal Medicine

## 2017-10-01 DIAGNOSIS — I712 Thoracic aortic aneurysm, without rupture, unspecified: Secondary | ICD-10-CM

## 2017-10-01 MED ORDER — IOPAMIDOL (ISOVUE-370) INJECTION 76%
75.0000 mL | Freq: Once | INTRAVENOUS | Status: AC | PRN
  Administered 2017-10-01: 75 mL via INTRAVENOUS

## 2019-12-30 IMAGING — CT CT ANGIO CHEST
2 of 5 series · 8 of 30 positions shown · IV contrast (iopamidol)
Comparison: 09/26/2015

CLINICAL DATA: Evaluate for thoracic aortic aneurysm.

EXAM:
CT ANGIOGRAPHY CHEST WITH CONTRAST
TECHNIQUE: Multidetector CT imaging of the chest was performed using the
standard protocol during bolus administration of intravenous
contrast. Multiplanar CT image reconstructions and MIPs were
obtained to evaluate the vascular anatomy.
CONTRAST:  75mL WA06DC-KNL IOPAMIDOL (WA06DC-KNL) INJECTION 76%

[Series 4: angio · axial · 0.85mm/px · z∈[-254,-59]mm · 3 of 157 slices shown]
[im 40/157  lung]
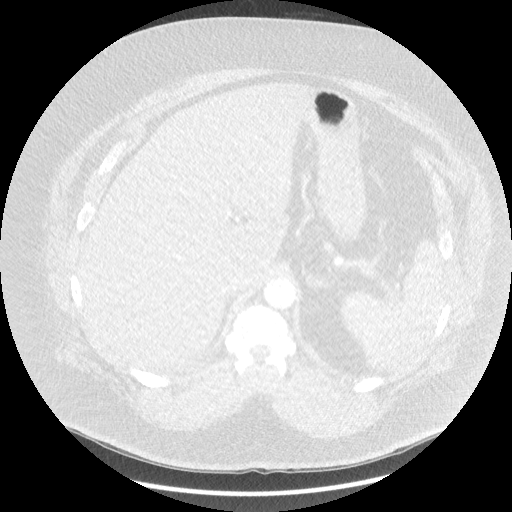
[im 79/157  mediastinal]
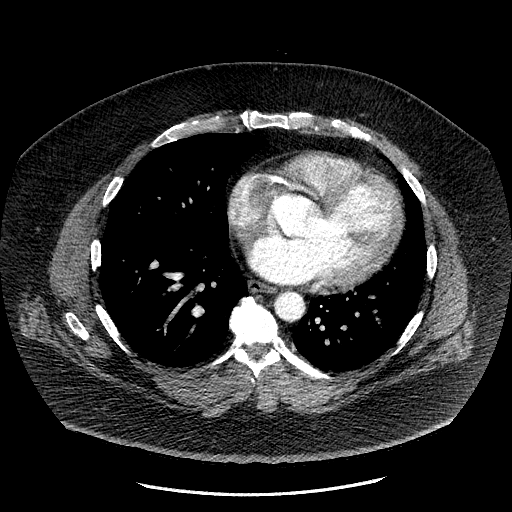
[im 118/157  lung]
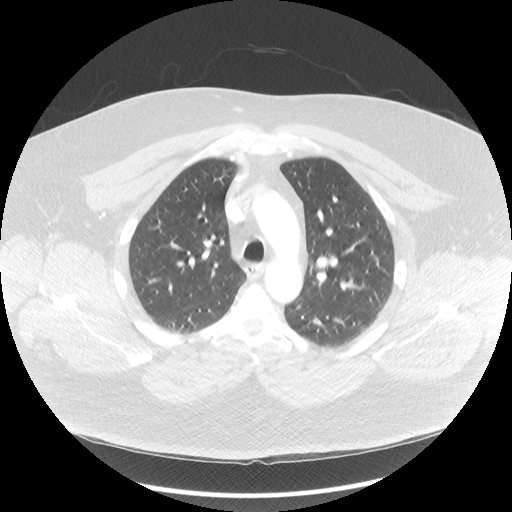

[Series 602: sagittal body · sagittal · 0.85mm/px · 5 of 176 slices shown]
[im 30/176  lung]
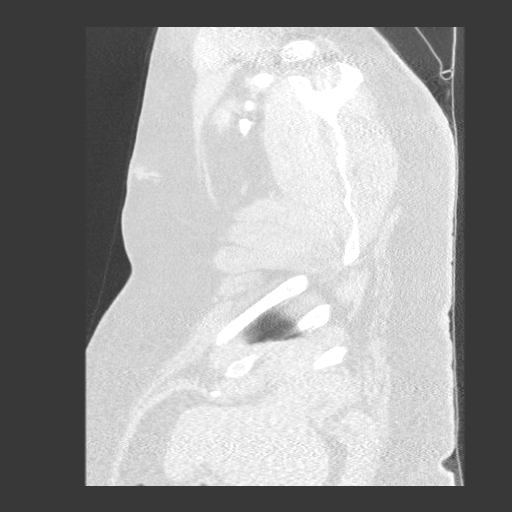
[im 59/176  lung]
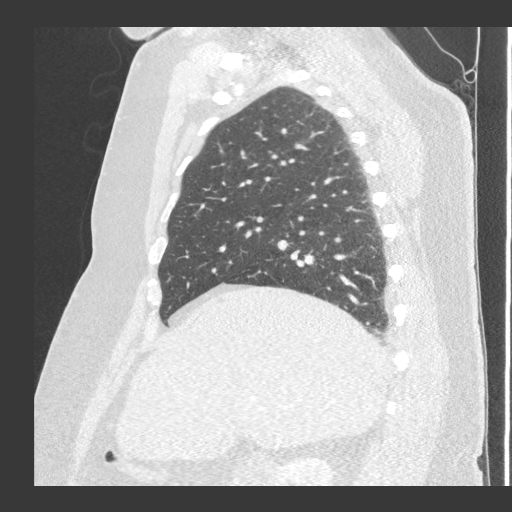
[im 88/176  lung]
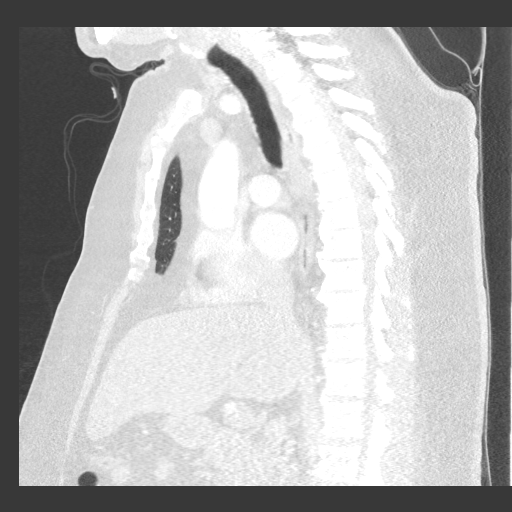
[im 117/176  lung]
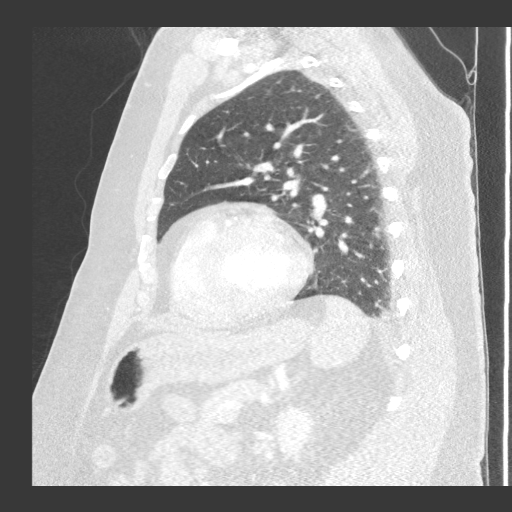
[im 146/176  lung]
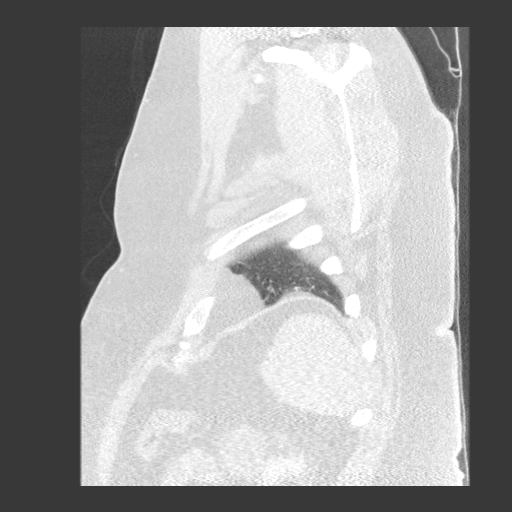

[8 of 30 positions shown; findings below may reference images not displayed]

FINDINGS: Cardiovascular: The heart size appears within normal limits. There
is no pericardial effusion. Aneurysm in the distal left main
coronary artery is stable measuring 1.2 x 1.1 cm, image 65 of series
4. The aortic root measures 3.7 cm, image 75 of series 601.
Unchanged. The ascending thoracic aorta measures 3.4 cm, image 70 of
series 601. At the level of the aortic isthmus the aorta is stable
measuring 2.7 cm, image 37 of series 4. At the level of the
diaphragmatic hiatus the aorta measures 2.6 cm.

Mediastinum/Nodes: The trachea appears patent and is midline. Normal
appearance of the esophagus. No enlarged mediastinal or hilar lymph
nodes. No axillary or supraclavicular adenopathy.

Lungs/Pleura: No pleural effusion identified. No airspace
consolidation or atelectasis.

Upper Abdomen: No acute abnormality. Diffuse hepatic steatosis is
again noted. Bilateral kidney cysts are incompletely visualized.
Previous cholecystectomy.

Musculoskeletal: No aggressive lytic or sclerotic bone lesions.

Review of the MIP images confirms the above findings.
IMPRESSION: 1. Stable appearance of the nonaneurysmal thoracic aorta.
2. Stable distal left main coronary artery aneurysm.
3. No active pulmonary disease.
4. Hepatic steatosis.

## 2021-02-06 ENCOUNTER — Encounter (HOSPITAL_COMMUNITY): Payer: Self-pay

## 2021-02-06 ENCOUNTER — Other Ambulatory Visit: Payer: Self-pay

## 2021-02-06 ENCOUNTER — Emergency Department (HOSPITAL_COMMUNITY)
Admission: EM | Admit: 2021-02-06 | Discharge: 2021-02-07 | Disposition: A | Discharge status: Home / Self Care | Payer: No Typology Code available for payment source | Attending: Emergency Medicine | Admitting: Emergency Medicine

## 2021-02-06 DIAGNOSIS — L0291 Cutaneous abscess, unspecified: Secondary | ICD-10-CM

## 2021-02-06 DIAGNOSIS — Z7984 Long term (current) use of oral hypoglycemic drugs: Secondary | ICD-10-CM | POA: Diagnosis not present

## 2021-02-06 DIAGNOSIS — Z87891 Personal history of nicotine dependence: Secondary | ICD-10-CM | POA: Insufficient documentation

## 2021-02-06 DIAGNOSIS — L02215 Cutaneous abscess of perineum: Secondary | ICD-10-CM | POA: Insufficient documentation

## 2021-02-06 DIAGNOSIS — Z79899 Other long term (current) drug therapy: Secondary | ICD-10-CM | POA: Insufficient documentation

## 2021-02-06 DIAGNOSIS — E119 Type 2 diabetes mellitus without complications: Secondary | ICD-10-CM | POA: Diagnosis not present

## 2021-02-06 DIAGNOSIS — E039 Hypothyroidism, unspecified: Secondary | ICD-10-CM | POA: Diagnosis not present

## 2021-02-06 DIAGNOSIS — I10 Essential (primary) hypertension: Secondary | ICD-10-CM | POA: Insufficient documentation

## 2021-02-06 LAB — CBC WITH DIFFERENTIAL/PLATELET
Abs Immature Granulocytes: 0.04 10*3/uL (ref 0.00–0.07)
Basophils Absolute: 0.1 10*3/uL (ref 0.0–0.1)
Basophils Relative: 1 %
Eosinophils Absolute: 0.2 10*3/uL (ref 0.0–0.5)
Eosinophils Relative: 2 %
HCT: 40 % (ref 39.0–52.0)
Hemoglobin: 13.2 g/dL (ref 13.0–17.0)
Immature Granulocytes: 0 %
Lymphocytes Relative: 19 %
Lymphs Abs: 1.9 10*3/uL (ref 0.7–4.0)
MCH: 29.7 pg (ref 26.0–34.0)
MCHC: 33 g/dL (ref 30.0–36.0)
MCV: 89.9 fL (ref 80.0–100.0)
Monocytes Absolute: 0.9 10*3/uL (ref 0.1–1.0)
Monocytes Relative: 9 %
Neutro Abs: 6.8 10*3/uL (ref 1.7–7.7)
Neutrophils Relative %: 69 %
Platelets: 251 10*3/uL (ref 150–400)
RBC: 4.45 MIL/uL (ref 4.22–5.81)
RDW: 13.6 % (ref 11.5–15.5)
WBC: 9.8 10*3/uL (ref 4.0–10.5)
nRBC: 0 % (ref 0.0–0.2)

## 2021-02-06 LAB — COMPREHENSIVE METABOLIC PANEL
ALT: 20 U/L (ref 0–44)
AST: 17 U/L (ref 15–41)
Albumin: 3.3 g/dL — ABNORMAL LOW (ref 3.5–5.0)
Alkaline Phosphatase: 40 U/L (ref 38–126)
Anion gap: 10 (ref 5–15)
BUN: 14 mg/dL (ref 6–20)
CO2: 23 mmol/L (ref 22–32)
Calcium: 8.5 mg/dL — ABNORMAL LOW (ref 8.9–10.3)
Chloride: 105 mmol/L (ref 98–111)
Creatinine, Ser: 0.92 mg/dL (ref 0.61–1.24)
GFR, Estimated: >60 mL/min (ref >60)
Glucose, Bld: 264 mg/dL — ABNORMAL HIGH (ref 70–99)
Potassium: 3.2 mmol/L — ABNORMAL LOW (ref 3.5–5.1)
Sodium: 138 mmol/L (ref 135–145)
Total Bilirubin: 0.4 mg/dL (ref 0.3–1.2)
Total Protein: 6 g/dL — ABNORMAL LOW (ref 6.5–8.1)

## 2021-02-06 LAB — LACTIC ACID, PLASMA: Lactic Acid, Venous: 1.4 mmol/L (ref 0.5–1.9)

## 2021-02-06 NOTE — ED Provider Notes (Signed)
Emergency Medicine Provider Triage Evaluation Note  Edward Odom , a 60 y.o. male  was evaluated in triage.  Pt complains of abscess to the perineum that has been present for 11 to 12 days.  No rectal pain, fevers, chills, drainage, abdominal pain, back pain, dysuria, drainage from the wound, saddle paresthesias, numbness, weakness.  He was seen by his PCP who started him on Bactrim, but was unable to start taking the medication until this morning.  He reports that his sugars have been running greater than 200.  He is wearing his Dexcom.  Review of Systems  Positive: Wounds, perineum pain Negative: Fever, chills, abdominal pain, back pain, dysuria, drainage from the wound, saddle paresthesias, numbness, weakness  Physical Exam  BP (!) 204/102 (BP Location: Right Arm)   Pulse 95   Temp 98.4 F (36.9 C) (Oral)   Resp 18   Ht 5\' 10"  (1.778 m)   Wt (!) 169.6 kg   SpO2 97%   BMI 53.66 kg/m  Gen:   Awake, no distress   Resp:  Normal effort  MSK:   Moves all 4 extremities spontaneously Other:  Fluctuant mass noted to the perineum.  Pain is not out of proportion to exam.  No surrounding red streaking, warmth, redness.  No drainage noted from the wound.  Medical Decision Making  Medically screening exam initiated at 10:19 PM.  Appropriate orders placed.  Edward Odom was informed that the remainder of the evaluation will be completed by another provider, this initial triage assessment does not replace that evaluation, and the importance of remaining in the ED until their evaluation is complete.  60 year old male with history of diabetes mellitus whose sugars have been greater than 200.  He developed an abscess about 11 to 12 days ago that has been worsening.  He saw his PCP who prescribed Bactrim, but he did not start the medication until today.  No fevers or constitutional symptoms.  I evaluated the perineum and did not see any red streaking, drainage, warmth, or significant redness.  Pain is not  out of proportion to exam.  I am less suspicious for Fournier's gangrene at this time.  Will order labs.  Patient stable.  He will require further work-up and evaluation in the emergency department.   46, PA-C 02/06/21 2225    2226, MD 02/08/21 [phone]

## 2021-02-06 NOTE — ED Triage Notes (Signed)
Patient reports abscess to his buttocks, was seen by PCP who started him on Bactrim, hx of DM II, states his sugars have been high, denies fever

## 2021-02-07 MED ORDER — ACETAMINOPHEN 500 MG PO TABS
1000.0000 mg | ORAL_TABLET | Freq: Once | ORAL | Status: AC
Start: 2021-02-07 — End: 2021-02-07
  Administered 2021-02-07: 1000 mg via ORAL
  Filled 2021-02-07: qty 2

## 2021-02-07 MED ORDER — LIDOCAINE-EPINEPHRINE (PF) 2 %-1:200000 IJ SOLN
10.0000 mL | Freq: Once | INTRAMUSCULAR | Status: DC
  Filled 2021-02-07: qty 20

## 2021-02-07 MED ORDER — OXYCODONE HCL 5 MG PO TABS
5.0000 mg | ORAL_TABLET | Freq: Once | ORAL | Status: AC
  Administered 2021-02-07: 5 mg via ORAL
  Filled 2021-02-07: qty 1
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
## 2021-02-07 NOTE — Discharge Instructions (Signed)
Warm compresses at least 4 times a day.  Continue your antibiotics.  Please return for rapid spreading redness or if you develop a fever.  Typically this gets reassessed in 48 hours.  This can be done here at urgent care or your family doctor's office.

## 2022-01-01 ENCOUNTER — Other Ambulatory Visit: Payer: Self-pay | Admitting: Family Medicine

## 2022-01-01 DIAGNOSIS — M7989 Other specified soft tissue disorders: Secondary | ICD-10-CM

## 2022-01-01 DIAGNOSIS — I871 Compression of vein: Secondary | ICD-10-CM

## 2022-01-23 ENCOUNTER — Ambulatory Visit
Admission: RE | Admit: 2022-01-23 | Discharge: 2022-01-23 | Disposition: A | Discharge status: Home / Self Care | Payer: No Typology Code available for payment source | Source: Ambulatory Visit | Attending: Family Medicine | Admitting: Family Medicine

## 2022-01-23 DIAGNOSIS — I871 Compression of vein: Secondary | ICD-10-CM

## 2022-01-23 DIAGNOSIS — M7989 Other specified soft tissue disorders: Secondary | ICD-10-CM

## 2022-01-23 MED ORDER — IOPAMIDOL (ISOVUE-370) INJECTION 76%
120.0000 mL | Freq: Once | INTRAVENOUS | Status: AC | PRN
  Administered 2022-01-23: 120 mL via INTRAVENOUS

## 2024-01-01 DIAGNOSIS — E669 Obesity, unspecified: Secondary | ICD-10-CM | POA: Insufficient documentation

## 2024-01-01 DIAGNOSIS — I4719 Other supraventricular tachycardia: Secondary | ICD-10-CM | POA: Insufficient documentation

## 2024-01-01 DIAGNOSIS — I440 Atrioventricular block, first degree: Secondary | ICD-10-CM | POA: Insufficient documentation

## 2024-01-01 DIAGNOSIS — E119 Type 2 diabetes mellitus without complications: Secondary | ICD-10-CM | POA: Insufficient documentation

## 2024-01-01 DIAGNOSIS — G4730 Sleep apnea, unspecified: Secondary | ICD-10-CM | POA: Insufficient documentation

## 2024-01-05 ENCOUNTER — Ambulatory Visit (INDEPENDENT_AMBULATORY_CARE_PROVIDER_SITE_OTHER): Admitting: Podiatry

## 2024-01-05 DIAGNOSIS — M79675 Pain in left toe(s): Secondary | ICD-10-CM | POA: Diagnosis not present

## 2024-01-05 DIAGNOSIS — B351 Tinea unguium: Secondary | ICD-10-CM | POA: Diagnosis not present

## 2024-01-05 DIAGNOSIS — I872 Venous insufficiency (chronic) (peripheral): Secondary | ICD-10-CM

## 2024-01-05 DIAGNOSIS — E1151 Type 2 diabetes mellitus with diabetic peripheral angiopathy without gangrene: Secondary | ICD-10-CM | POA: Diagnosis not present

## 2024-01-05 DIAGNOSIS — M79674 Pain in right toe(s): Secondary | ICD-10-CM | POA: Diagnosis not present
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
## 2024-04-20 ENCOUNTER — Ambulatory Visit (INDEPENDENT_AMBULATORY_CARE_PROVIDER_SITE_OTHER): Admitting: Podiatry

## 2024-04-20 DIAGNOSIS — M79674 Pain in right toe(s): Secondary | ICD-10-CM

## 2024-04-20 DIAGNOSIS — M79675 Pain in left toe(s): Secondary | ICD-10-CM

## 2024-04-20 DIAGNOSIS — B351 Tinea unguium: Secondary | ICD-10-CM | POA: Diagnosis not present
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
## 2024-04-22 IMAGING — CT CT VENOGRAM ABD-PELV
2 of 3 series · 13 of 32 positions shown, 19 images · IV contrast (APPLIED)
Comparison: None Available.

CLINICAL DATA: Left leg swelling, enlarged saphenous vein

EXAM:
CT VENOGRAM ABDOMEN AND PELVIS
TECHNIQUE: Multidetector CT imaging of the abdomen and pelvis was performed
using the standard venous protocol during bolus administration of
intravenous contrast. Multiplanar reconstructed images and MIPs were
obtained and reviewed to evaluate the vascular anatomy.

[Series 2: portal venous · axial · portal-venous · 0.98mm/px · z∈[+743,+1163]mm · 9 of 106 slices shown, 15 images]
[im 11/106  soft-tissue]
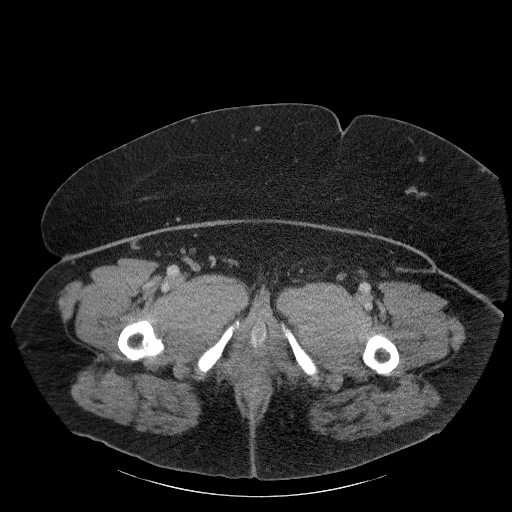
[im 11/106  bone]
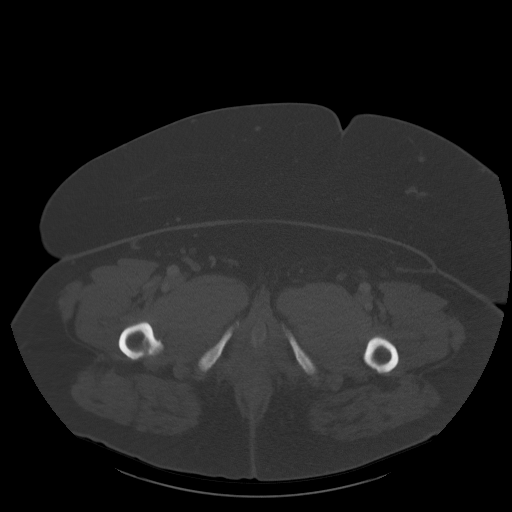
[im 22/106  soft-tissue]
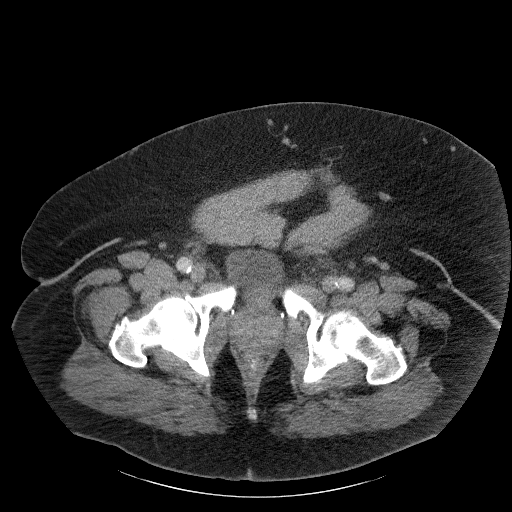
[im 32/106  soft-tissue]
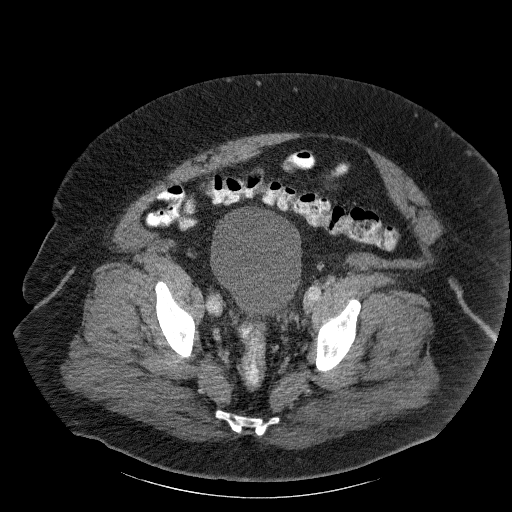
[im 43/106  soft-tissue]
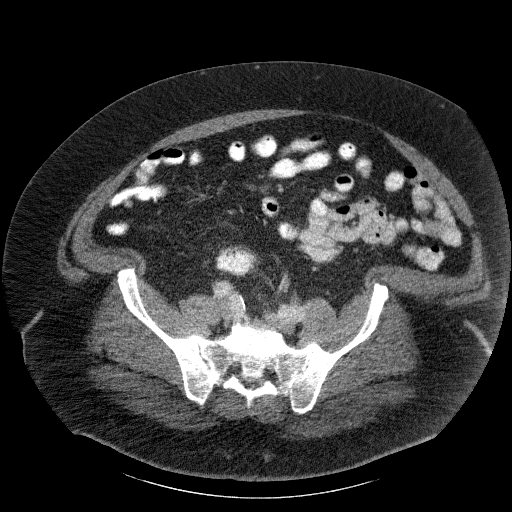
[im 53/106  soft-tissue]
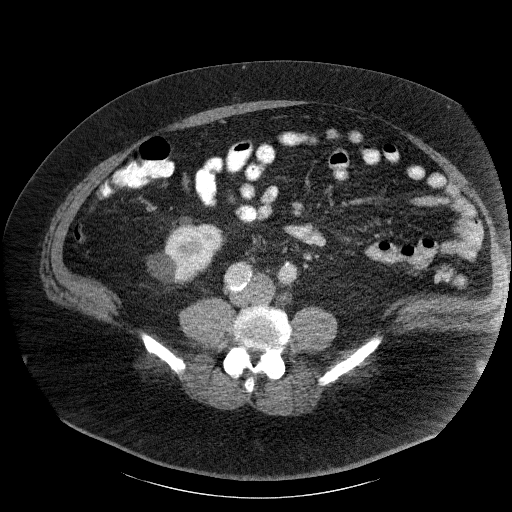
[im 64/106  soft-tissue]
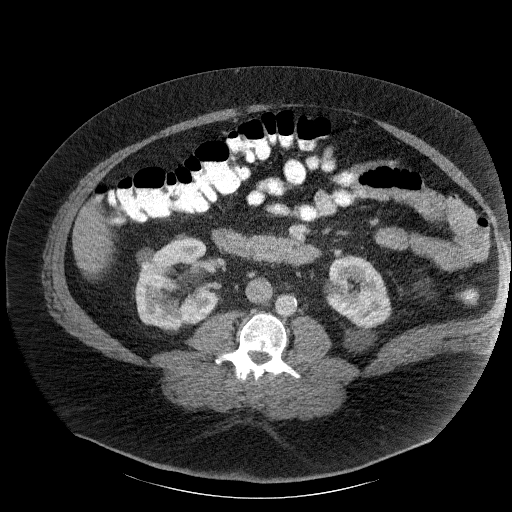
[im 64/106  lung]
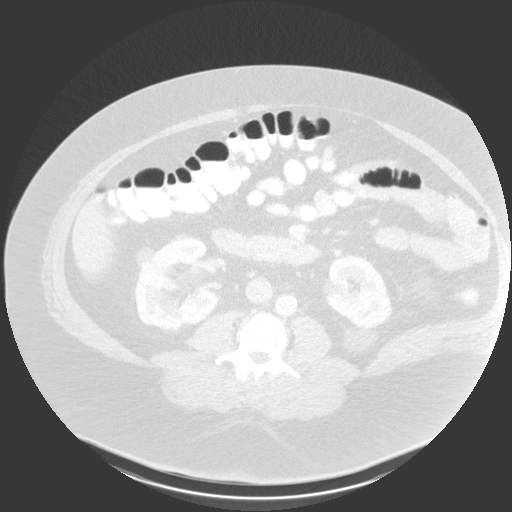
[im 74/106  soft-tissue]
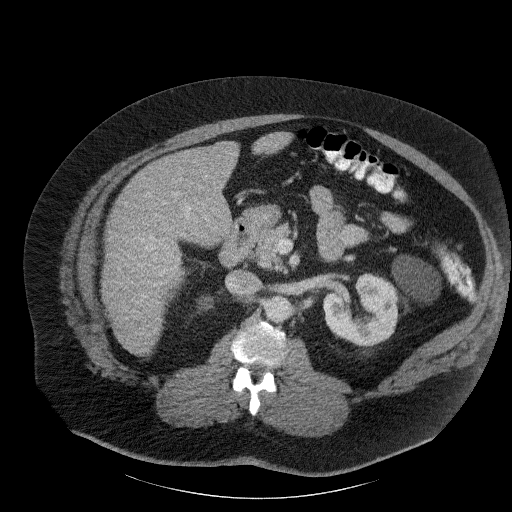
[im 74/106  lung]
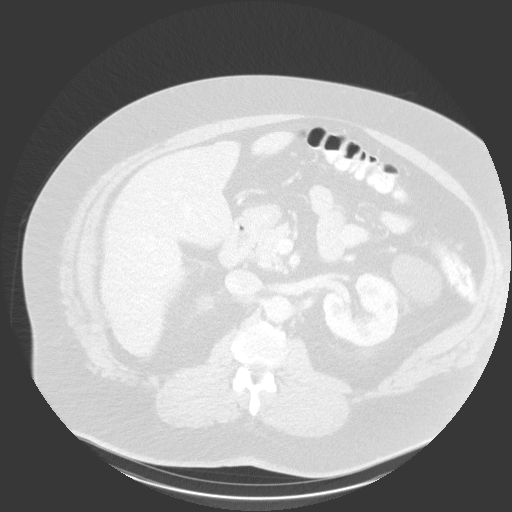
[im 85/106  soft-tissue]
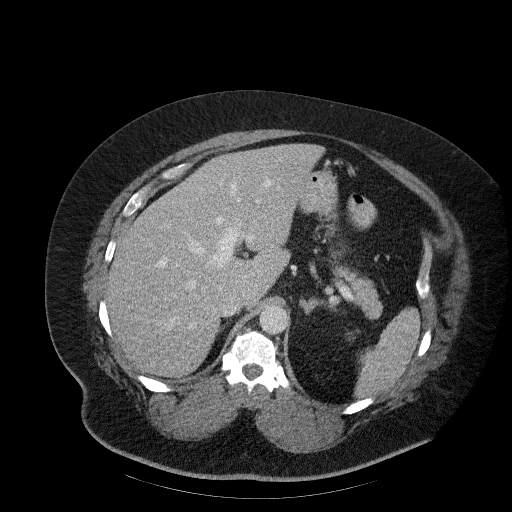
[im 85/106  lung]
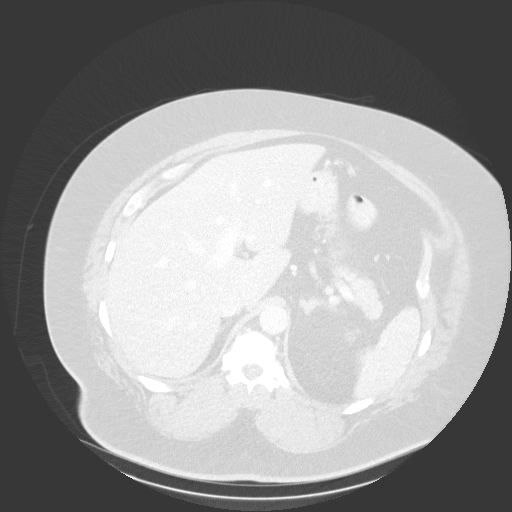
[im 95/106  soft-tissue]
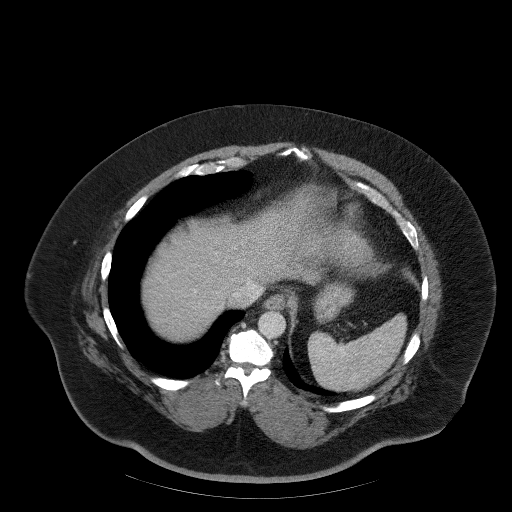
[im 95/106  lung]
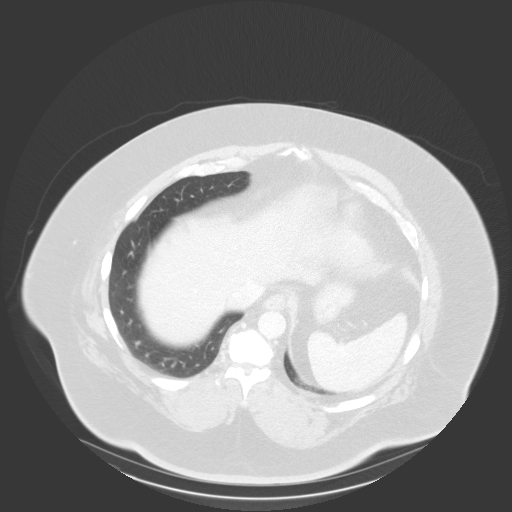
[im 95/106  bone]
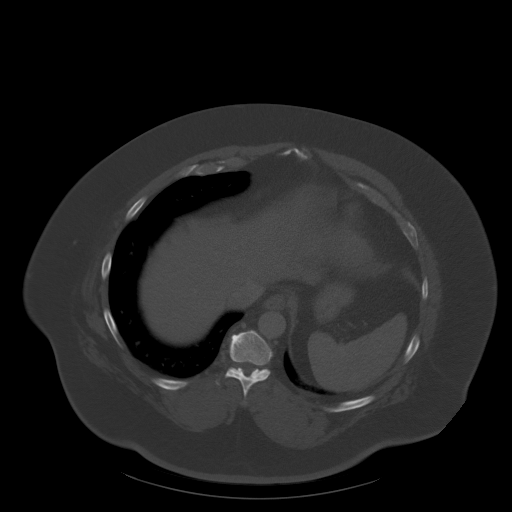

[Series 8: lung · axial · 0.98mm/px · z∈[+1058,+1118]mm · 4 of 90 slices shown]
[im 10/90  bone]
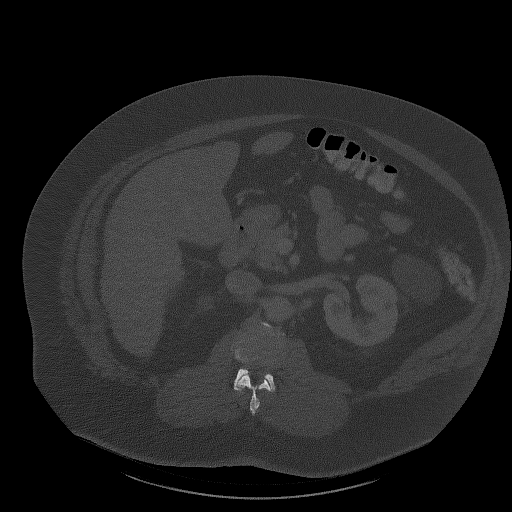
[im 20/90  bone]
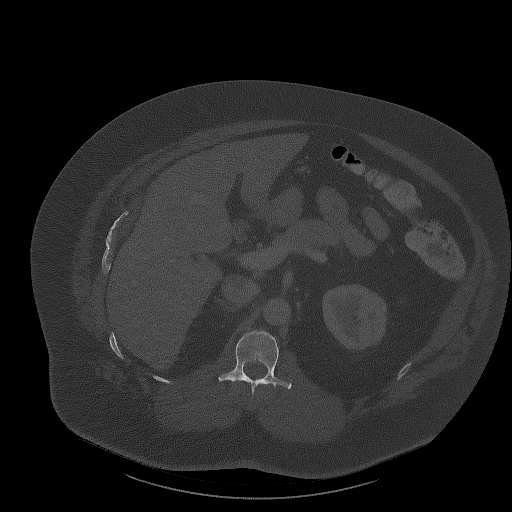
[im 30/90  bone]
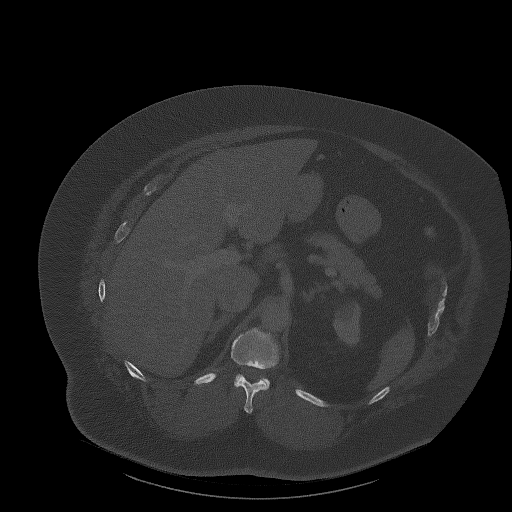
[im 40/90  bone]
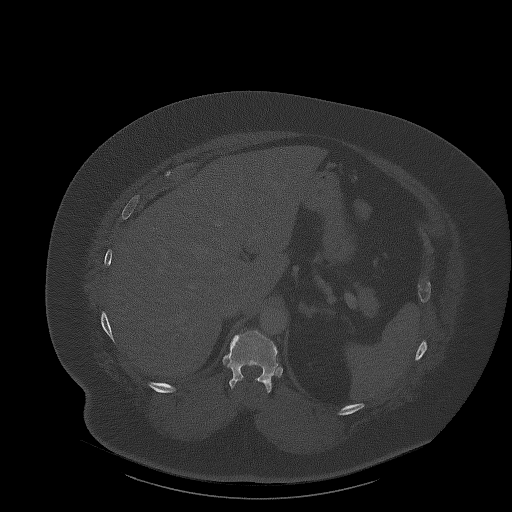

[13 of 32 positions shown; findings below may reference images not displayed]

RADIATION DOSE REDUCTION: This exam was performed according to the
departmental dose-optimization program which includes automated
exposure control, adjustment of the mA and/or kV according to
patient size and/or use of iterative reconstruction technique.

CONTRAST:  120mL 500ZRY-SKU IOPAMIDOL (500ZRY-SKU) INJECTION 76%
FINDINGS: Moderate calcified aortoiliac arterial plaque without stenosis.
Fusiform aneurysmal dilatation of the right common iliac artery
cm diameter, left 2.2 cm.

Delayed venous phase imaging shows homogeneous enhancement of
bilateral iliac venous systems and IVC. No evidence of iliac venous
thrombosis. No significant venous collaterals. Patent bilateral
renal veins.

Visualized lung bases clear. No pleural or pericardial effusion.
Coronary calcifications.

Cholecystectomy clips.  No focal liver lesion.

Pancreas unremarkable

Spleen normal in size without focal lesion

No adrenal mass. Bilateral renal cysts, largest 7.5 cm exophytic
from the left lower pole; no imaging follow-up recommended. No
urolithiasis or hydronephrosis. Urinary bladder incompletely
distended.

Stomach and small bowel are nondistended. Normal appendix. The colon
is nondilated, unremarkable.

Mild prostate enlargement. Bilateral pelvic phleboliths. No ascites.
No free air.

Mild multilevel spondylitic changes in the lower thoracic and lumbar
spine without acute finding.
IMPRESSION: 1. Negative for iliocaval venous pathology, thrombus, mass or other
lesion.
2. Coronary and aortoiliac atherosclerosis (48970-170.0).

## 2024-07-26 ENCOUNTER — Ambulatory Visit: Admitting: Podiatry

## 2024-12-20 ENCOUNTER — Ambulatory Visit: Admitting: Family Medicine
